# Patient Record
Sex: Female | Born: 1965 | Hispanic: No | Marital: Married | Smoking: Never smoker
Health system: Southern US, Community
[De-identification: ages and names within clinical notes are randomized; demographics above are authoritative.]

## PROBLEM LIST (undated history)

## (undated) HISTORY — PX: GASTRIC BYPASS: SHX52

---

## 2008-11-18 ENCOUNTER — Emergency Department (HOSPITAL_BASED_OUTPATIENT_CLINIC_OR_DEPARTMENT_OTHER): Admission: EM | Admit: 2008-11-18 | Discharge: 2008-11-18 | Payer: Self-pay | Admitting: Emergency Medicine

## 2010-06-30 ENCOUNTER — Emergency Department (HOSPITAL_BASED_OUTPATIENT_CLINIC_OR_DEPARTMENT_OTHER): Admission: EM | Admit: 2010-06-30 | Discharge: 2010-06-30 | Payer: Self-pay | Admitting: Emergency Medicine

## 2010-06-30 ENCOUNTER — Ambulatory Visit: Payer: Self-pay | Admitting: Interventional Radiology

## 2010-12-14 LAB — POCT CARDIAC MARKERS: Myoglobin, poc: 32.4 ng/mL (ref 12–200)

## 2010-12-14 LAB — CBC
HCT: 37.9 % (ref 36.0–46.0)
MCHC: 33.4 g/dL (ref 30.0–36.0)
MCV: 87 fL (ref 78.0–100.0)
RDW: 11.9 % (ref 11.5–15.5)

## 2010-12-14 LAB — COMPREHENSIVE METABOLIC PANEL
BUN: 14 mg/dL (ref 6–23)
Calcium: 9 mg/dL (ref 8.4–10.5)
Creatinine, Ser: 0.6 mg/dL (ref 0.4–1.2)
Glucose, Bld: 83 mg/dL (ref 70–99)
Total Protein: 7.4 g/dL (ref 6.0–8.3)

## 2010-12-14 LAB — DIFFERENTIAL
Basophils Relative: 1 % (ref 0–1)
Lymphs Abs: 2 10*3/uL (ref 0.7–4.0)
Monocytes Relative: 5 % (ref 3–12)
Neutro Abs: 3 10*3/uL (ref 1.7–7.7)
Neutrophils Relative %: 53 % (ref 43–77)

## 2017-12-25 ENCOUNTER — Emergency Department (HOSPITAL_COMMUNITY): Payer: 59

## 2017-12-25 ENCOUNTER — Other Ambulatory Visit: Payer: Self-pay

## 2017-12-25 ENCOUNTER — Encounter (HOSPITAL_COMMUNITY): Payer: Self-pay

## 2017-12-25 ENCOUNTER — Emergency Department (HOSPITAL_COMMUNITY)
Admission: EM | Admit: 2017-12-25 | Discharge: 2017-12-25 | Disposition: A | Discharge status: Home / Self Care | Payer: 59 | Attending: Emergency Medicine | Admitting: Emergency Medicine

## 2017-12-25 DIAGNOSIS — R079 Chest pain, unspecified: Secondary | ICD-10-CM | POA: Diagnosis present

## 2017-12-25 LAB — BASIC METABOLIC PANEL
ANION GAP: 8 (ref 5–15)
BUN: 7 mg/dL (ref 6–20)
CALCIUM: 8.8 mg/dL — AB (ref 8.9–10.3)
CO2: 22 mmol/L (ref 22–32)
Chloride: 107 mmol/L (ref 101–111)
Creatinine, Ser: 0.61 mg/dL (ref 0.44–1.00)
GLUCOSE: 97 mg/dL (ref 65–99)
Potassium: 3.8 mmol/L (ref 3.5–5.1)
Sodium: 137 mmol/L (ref 135–145)

## 2017-12-25 LAB — CBC
HCT: 38.2 % (ref 36.0–46.0)
Hemoglobin: 12.2 g/dL (ref 12.0–15.0)
MCH: 28 pg (ref 26.0–34.0)
MCHC: 31.9 g/dL (ref 30.0–36.0)
MCV: 87.6 fL (ref 78.0–100.0)
PLATELETS: 262 10*3/uL (ref 150–400)
RBC: 4.36 MIL/uL (ref 3.87–5.11)
RDW: 14 % (ref 11.5–15.5)
WBC: 7.2 10*3/uL (ref 4.0–10.5)

## 2017-12-25 LAB — I-STAT TROPONIN, ED: TROPONIN I, POC: 0 ng/mL (ref 0.00–0.08)

## 2017-12-25 NOTE — Discharge Instructions (Addendum)
The testing today, is reassuring.  There is no sign that you have had a heart attack or will have a heart attack.  Your pain may be from nerves or muscle, and associated with stress.  It is okay to take Tylenol as needed for pain.  I suggest that you call your PCP to arrange a follow-up appointment to discuss the episode today, and consider further testing and treatment.  Your doctor may recommend a stress test to further evaluate you for cardiac problems.  Please return here if you have additional concerns and need to be checked again.

## 2017-12-25 NOTE — ED Provider Notes (Addendum)
[facility] EMERGENCY DEPARTMENT Provider Note   CSN: [id number] Arrival date & time: 12/25/17  1215     History   Chief Complaint Chief Complaint  Patient presents with  . Chest Pain    HPI Dana Navarro is a 52 y.o. female.  She presents for evaluation of chest discomfort the patient has had similar episodes of chest pain previously to that which happened earlier today.  Today she was dealing with a stressful situation at work, when she had a tight feeling in her chest.  Later she developed some pain in her left arm.  Both of these persisted until she saw her PCP, who treated her with nitroglycerin and aspirin.  EMS was summoned who transferred her here and treated her with additional nitroglycerin x1.  Her chest pain resolved after the initial nitroglycerin and has not returned.  She has noticed some persistent pain in her left arm, but it is very mild.  She has occasional neck pain, that seems to come and go.  She denies any associated fever, chills, nausea, vomiting, diaphoresis, gait difficulty or dizziness.  There are no other known modifying factors.  HPI  History reviewed. No pertinent past medical history.  There are no active problems to display for this patient.   Past Surgical History:  Procedure Laterality Date  . GASTRIC BYPASS       OB History    Gravida  1   Para      Term      Preterm      AB      Living        SAB      TAB      Ectopic      Multiple      Live Births               Home Medications    Prior to Admission medications   Not on File    Family History No family history on file.  Social History Social History   Tobacco Use  . Smoking status: Never Smoker  . Smokeless tobacco: Never Used  Substance Use Topics  . Alcohol use: Never    Frequency: Never  . Drug use: Never     Allergies   Patient has no known allergies.   Review of Systems Review of Systems  All other systems reviewed and  are negative.    Physical Exam Updated Vital Signs BP 120/84 (BP Location: Right Arm)   Pulse 65   Temp 98.3 F (36.8 C) (Oral)   Resp 16   Ht 5\' 4"  (1.626 m)   Wt 120.2 kg (265 lb)   LMP 11/28/2017 (Approximate) Comment: "end of last month"   SpO2 92%   BMI 45.49 kg/m   Physical Exam  Constitutional: She is oriented to person, place, and time. She appears well-developed. She does not appear ill.  Morbidly obese  HENT:  Head: Normocephalic and atraumatic.  Eyes: Pupils are equal, round, and reactive to light. Conjunctivae and EOM are normal.  Neck: Normal range of motion and phonation normal. Neck supple.  Cardiovascular: Normal rate and regular rhythm.  Pulmonary/Chest: Effort normal and breath sounds normal. She exhibits no tenderness.  Abdominal: Soft. She exhibits no distension. There is no tenderness. There is no guarding.  Musculoskeletal: Normal range of motion. She exhibits no edema, tenderness or deformity.  Neurological: She is alert and oriented to person, place, and time. She exhibits normal muscle tone.  Skin:  Skin is warm and dry.  Psychiatric: She has a normal mood and affect. Her behavior is normal. Judgment and thought content normal.  Nursing note and vitals reviewed.    ED Treatments / Results  Labs (all labs ordered are listed, but only abnormal results are displayed) Labs Reviewed  BASIC METABOLIC PANEL - Abnormal; Notable for the following components:      Result Value   Calcium 8.8 (*)    All other components within normal limits  CBC  I-STAT TROPONIN, ED    EKG None    Date: 12/25/17  Rate: 75  Rhythm: normal sinus rhythm  QRS Axis: normal  PR and QT Intervals: normal  ST/T Wave abnormalities: nonspecific T wave changes  PR and QRS Conduction Disutrbances:none     Radiology Dg Chest 2 View  Result Date: 12/25/2017 CLINICAL DATA:  Left chest pain EXAM: CHEST - 2 VIEW COMPARISON:  06/30/2010 FINDINGS: Lungs are clear.  No pleural  effusion or pneumothorax. The heart is normal in size. Visualized osseous structures are within normal limits. IMPRESSION: Normal chest radiographs. Electronically Signed   By: Charline Bills M.D.   On: 12/25/2017 13:08    Procedures Procedures (including critical care time)  Medications Ordered in ED Medications - No data to display   Initial Impression / Assessment and Plan / ED Course  I have reviewed the triage vital signs and the nursing notes.  Pertinent labs & imaging results that were available during my care of the patient were reviewed by me and considered in my medical decision making (see chart for details).      Patient Vitals for the past 24 hrs:  BP Temp Temp src Pulse Resp SpO2 Height Weight  12/25/17 1632 120/84 - - 65 16 92 % - -  12/25/17 1409 111/63 - - 70 16 99 % - -  12/25/17 1230 - - - - - - 5\' 4"  (1.626 m) 120.2 kg (265 lb)  12/25/17 1228 126/89 98.3 F (36.8 C) Oral 66 18 100 % - -    6:42 PM Reevaluation with update and discussion. After initial assessment and treatment, an updated evaluation reveals no change in clinical status.  Findings discussed with patient and her husband, all questions were answered. Mancel Bale      Final Clinical Impressions(s) / ED Diagnoses   Final diagnoses:  Nonspecific chest pain   Noncardiac chest and arm pain, unlikely to represent an acute coronary syndrome, or significant pulmonary process.  Doubt ACS, PE or pneumonia.  Possible cervical radiculopathy versus muscle pain from stress related event.  No indication for further ED evaluation or hospitalization at this time.   Nursing Notes Reviewed/ Care Coordinated Applicable Imaging Reviewed Interpretation of Laboratory Data incorporated into ED treatment  The patient appears reasonably screened and/or stabilized for discharge and I doubt any other medical condition or other [facility] requiring further screening, evaluation, or treatment in the ED at this time prior  to discharge.  Plan: Home Medications-OTC analgesia as needed; Home Treatments-rest, gradually increase activities.  Consider stress management techniques; return here if the recommended treatment, does not improve the symptoms; Recommended follow up-PCP follow-up for further evaluation and treatment as needed 1-2 weeks.     ED Discharge Orders    None       Mancel Bale, MD 12/25/17 1844    Mancel Bale, MD 01/06/18 [phone]

## 2017-12-25 NOTE — ED Triage Notes (Signed)
Pt. Was at work, developed lt., arm pain that radiated into her lt. Chest area.  Pt. Denies any sob  , n/v, skin was warm and dry.  Pt. Is alert and oriented X4.  Pt. Reports having stress at work.  She drove herself to the MD and they did an ECG and she received 324 mg ASA and 1 nitro.  The nitro relieved the chest pain but pt. Continues to have the lt. Arm pain,.  Pt. Received another dose of Nitro via paramedics her chest pain is resolved and lt. Arm is constant but not as sharp.   ECG  And labs completed in triage .  Pt. Had an episode like this yesterday, pt. Took an Advil and it went away.

## 2018-07-03 ENCOUNTER — Encounter (HOSPITAL_COMMUNITY): Payer: 59

## 2018-07-04 ENCOUNTER — Inpatient Hospital Stay (HOSPITAL_COMMUNITY): Admission: RE | Admit: 2018-07-04 | Payer: 59 | Source: Ambulatory Visit

## 2018-07-08 ENCOUNTER — Other Ambulatory Visit (HOSPITAL_COMMUNITY): Payer: Self-pay | Admitting: Physician Assistant

## 2020-05-30 ENCOUNTER — Encounter (HOSPITAL_BASED_OUTPATIENT_CLINIC_OR_DEPARTMENT_OTHER): Payer: Self-pay | Admitting: *Deleted

## 2020-05-30 ENCOUNTER — Emergency Department (HOSPITAL_BASED_OUTPATIENT_CLINIC_OR_DEPARTMENT_OTHER)
Admission: EM | Admit: 2020-05-30 | Discharge: 2020-05-30 | Disposition: A | Discharge status: Home / Self Care | Payer: 59 | Attending: Emergency Medicine | Admitting: Emergency Medicine

## 2020-05-30 ENCOUNTER — Emergency Department (HOSPITAL_BASED_OUTPATIENT_CLINIC_OR_DEPARTMENT_OTHER): Payer: 59

## 2020-05-30 ENCOUNTER — Other Ambulatory Visit: Payer: Self-pay

## 2020-05-30 DIAGNOSIS — R1012 Left upper quadrant pain: Secondary | ICD-10-CM | POA: Diagnosis not present

## 2020-05-30 DIAGNOSIS — R10819 Abdominal tenderness, unspecified site: Secondary | ICD-10-CM | POA: Insufficient documentation

## 2020-05-30 DIAGNOSIS — Z79899 Other long term (current) drug therapy: Secondary | ICD-10-CM | POA: Insufficient documentation

## 2020-05-30 DIAGNOSIS — R1011 Right upper quadrant pain: Secondary | ICD-10-CM | POA: Diagnosis present

## 2020-05-30 LAB — CBC WITH DIFFERENTIAL/PLATELET
Abs Immature Granulocytes: 0.02 10*3/uL (ref 0.00–0.07)
Basophils Absolute: 0 10*3/uL (ref 0.0–0.1)
Basophils Relative: 0 %
Eosinophils Absolute: 0.2 10*3/uL (ref 0.0–0.5)
Eosinophils Relative: 2 %
HCT: 37.9 % (ref 36.0–46.0)
Hemoglobin: 11.7 g/dL — ABNORMAL LOW (ref 12.0–15.0)
Immature Granulocytes: 0 %
Lymphocytes Relative: 18 %
Lymphs Abs: 1.7 10*3/uL (ref 0.7–4.0)
MCH: 27.1 pg (ref 26.0–34.0)
MCHC: 30.9 g/dL (ref 30.0–36.0)
MCV: 87.7 fL (ref 80.0–100.0)
Monocytes Absolute: 0.5 10*3/uL (ref 0.1–1.0)
Monocytes Relative: 6 %
Neutro Abs: 6.9 10*3/uL (ref 1.7–7.7)
Neutrophils Relative %: 74 %
Platelets: 260 10*3/uL (ref 150–400)
RBC: 4.32 MIL/uL (ref 3.87–5.11)
RDW: 14.4 % (ref 11.5–15.5)
WBC: 9.3 10*3/uL (ref 4.0–10.5)
nRBC: 0 % (ref 0.0–0.2)

## 2020-05-30 LAB — URINALYSIS, ROUTINE W REFLEX MICROSCOPIC
Bilirubin Urine: NEGATIVE
Glucose, UA: NEGATIVE mg/dL
Ketones, ur: NEGATIVE mg/dL
Leukocytes,Ua: NEGATIVE
Nitrite: NEGATIVE
Protein, ur: NEGATIVE mg/dL
Specific Gravity, Urine: <1.005 — ABNORMAL LOW (ref 1.005–1.030)
pH: 5.5 (ref 5.0–8.0)

## 2020-05-30 LAB — URINALYSIS, MICROSCOPIC (REFLEX)

## 2020-05-30 LAB — COMPREHENSIVE METABOLIC PANEL
ALT: 18 U/L (ref 0–44)
AST: 20 U/L (ref 15–41)
Albumin: 3.9 g/dL (ref 3.5–5.0)
Alkaline Phosphatase: 65 U/L (ref 38–126)
Anion gap: 9 (ref 5–15)
BUN: 16 mg/dL (ref 6–20)
CO2: 20 mmol/L — ABNORMAL LOW (ref 22–32)
Calcium: 8.6 mg/dL — ABNORMAL LOW (ref 8.9–10.3)
Chloride: 109 mmol/L (ref 98–111)
Creatinine, Ser: 0.67 mg/dL (ref 0.44–1.00)
GFR calc Af Amer: >60 mL/min (ref >60)
GFR calc non Af Amer: >60 mL/min (ref >60)
Glucose, Bld: 95 mg/dL (ref 70–99)
Potassium: 3.9 mmol/L (ref 3.5–5.1)
Sodium: 138 mmol/L (ref 135–145)
Total Bilirubin: 0.4 mg/dL (ref 0.3–1.2)
Total Protein: 7.2 g/dL (ref 6.5–8.1)

## 2020-05-30 LAB — PREGNANCY, URINE: Preg Test, Ur: NEGATIVE

## 2020-05-30 LAB — LIPASE, BLOOD: Lipase: 29 U/L (ref 11–51)

## 2020-05-30 MED ORDER — OMEPRAZOLE 20 MG PO CPDR: 20.0000 mg | DELAYED_RELEASE_CAPSULE | Freq: Every day | ORAL | 0 refills | Status: DC

## 2020-05-30 MED FILL — OMEPRAZOLE 20 MG CAP: 20 | 21 days supply | Qty: 21 | Fill #0

## 2020-05-30 NOTE — ED Notes (Signed)
ED Provider at bedside. 

## 2020-05-30 NOTE — Discharge Instructions (Addendum)
You have been seen here for abdominal pain.  Lab work and imaging all look reassuring.  I prescribed you an acid pill please take as prescribed.  I also provided you food choices for acid reflux.  You may take over-the-counter pain medications like Tylenol every 6 hours as needed for pain.  Please follow dosing on the back of bottle.  I have given the contact information for [facility] please call them as I feel you need further follow-up with them.  I want to come back to the emergency department if develop severe chest pain, shortness of breath, severe abdominal pain, uncontrolled nausea, vomiting, diarrhea as these symptoms require further evaluation management.

## 2020-05-30 NOTE — ED Provider Notes (Signed)
[place] EMERGENCY DEPARTMENT Provider Note   CSN: [id number] Arrival date & time: 05/30/20  1104     History Chief Complaint  Patient presents with  . Abdominal Pain  . Back Pain    Dana Navarro is a 54 y.o. female.  HPI Patient with significant medical history of cholecystectomy, gastric bypass presents to the emergency department with right upper quadrant pain that has been going for the last year.  She explains over the last 3 months the pain has increasingly gotten worse.  She describes a consistent sharp stabbing pain in her right upper quadrant that feels sometimes in her right upper flank.  She denies any associated nausea, vomiting, diarrhea, fevers, chills, shortness of breath, dysuria. she does note that when she lays down the pain tends to get better but sitting up and moving around generally makes the pain worse.  She denies any association with food, has no history of pancreatitis, kidney stones, or liver abnormalities.  Patient denies headache, fever, chills, shortness of breath, chest pain, nausea, vomiting, diarrhea, pedal edema.    History reviewed. No pertinent past medical history.  There are no problems to display for this patient.   Past Surgical History:  Procedure Laterality Date  . CHOLECYSTECTOMY    . GASTRIC BYPASS    . GASTRIC BYPASS       OB History    Gravida  1   Para      Term      Preterm      AB      Living        SAB      TAB      Ectopic      Multiple      Live Births              History reviewed. No pertinent family history.  Social History   Tobacco Use  . Smoking status: Never Smoker  . Smokeless tobacco: Never Used  Substance Use Topics  . Alcohol use: Never  . Drug use: Never    Home Medications Prior to Admission medications   Medication Sig Start Date End Date Taking? Authorizing Provider  omeprazole (PRILOSEC) 20 MG capsule Take 1 capsule (20 mg total) by mouth daily for 21 days.  05/30/20 06/20/20  Marcello Fennel, PA-C    Allergies    Patient has no known allergies.  Review of Systems   Review of Systems  Constitutional: Negative for chills and fever.  HENT: Negative for congestion, sore throat and tinnitus.   Respiratory: Negative for cough and shortness of breath.   Cardiovascular: Negative for chest pain.  Gastrointestinal: Positive for abdominal pain. Negative for nausea and vomiting.  Genitourinary: Positive for flank pain. Negative for dysuria, enuresis and vaginal bleeding.  Musculoskeletal: Negative for back pain.  Skin: Negative for rash.  Neurological: Negative for dizziness and headaches.  Hematological: Does not bruise/bleed easily.    Physical Exam Updated Vital Signs BP 118/71 (BP Location: Right Arm)   Pulse 86   Temp 98.6 F (37 C) (Oral)   Resp 18   Ht _0  (1.575 m)   Wt 119.3 kg   LMP 05/06/2020   SpO2 100%   Breastfeeding Unknown   BMI 48.10 kg/m   Physical Exam Vitals and nursing note reviewed.  Constitutional:      General: She is not in acute distress.    Appearance: She is not ill-appearing.  HENT:     Head: Normocephalic and  atraumatic.     Nose: No congestion.     Mouth/Throat:     Mouth: Mucous membranes are moist.     Pharynx: Oropharynx is clear.  Eyes:     General: No scleral icterus. Cardiovascular:     Rate and Rhythm: Normal rate and regular rhythm.     Pulses: Normal pulses.     Heart sounds: No murmur heard.  No friction rub. No gallop.   Pulmonary:     Effort: No respiratory distress.     Breath sounds: No wheezing, rhonchi or rales.  Abdominal:     General: There is no distension.     Palpations: Abdomen is soft.     Tenderness: There is abdominal tenderness. There is no right CVA tenderness, left CVA tenderness or guarding.     Comments: Patient's abdomen was visualized, nondistended, normal active bowel sounds, dull to percussion, slight tenderness to palpation noted in the epigastric  region as well as the right upper quadrant.  No rebound tenderness, negative peritoneal sign.  Musculoskeletal:        General: No swelling.     Right lower leg: No edema.     Left lower leg: No edema.  Skin:    General: Skin is warm and dry.     Capillary Refill: Capillary refill takes less than 2 seconds.     Findings: No rash.  Neurological:     General: No focal deficit present.     Mental Status: She is alert.  Psychiatric:        Mood and Affect: Mood normal.     ED Results / Procedures / Treatments   Labs (all labs ordered are listed, but only abnormal results are displayed) Labs Reviewed  CBC WITH DIFFERENTIAL/PLATELET - Abnormal; Notable for the following components:      Result Value   Hemoglobin 11.7 (*)    All other components within normal limits  COMPREHENSIVE METABOLIC PANEL - Abnormal; Notable for the following components:   CO2 20 (*)    Calcium 8.6 (*)    All other components within normal limits  URINALYSIS, ROUTINE W REFLEX MICROSCOPIC - Abnormal; Notable for the following components:   Specific Gravity, Urine <1.005 (*)    Hgb urine dipstick TRACE (*)    All other components within normal limits  URINALYSIS, MICROSCOPIC (REFLEX) - Abnormal; Notable for the following components:   Bacteria, UA RARE (*)    All other components within normal limits  LIPASE, BLOOD  PREGNANCY, URINE    EKG None  Radiology US Abdomen Limited  Result Date: 05/30/2020 CLINICAL DATA:  Right upper quadrant pain. EXAM: ULTRASOUND ABDOMEN LIMITED RIGHT UPPER QUADRANT COMPARISON:  None. FINDINGS: Gallbladder: Status post cholecystectomy. Common bile duct: Diameter: 0.4 cm. Liver: Single calcification measuring 1.1 cm which may be due to old granulomatous disease. No worrisome liver lesion. Echogenicity is increased. Portal vein is patent on color Doppler imaging with normal direction of blood flow towards the liver. Other: None. IMPRESSION: No acute abnormality. Fatty  infiltration of the liver. Status post cholecystectomy. Electronically Signed   By: Inge Rise M.D.   On: 05/30/2020 13:59   DG Chest Port 1 View  Result Date: 05/30/2020 CLINICAL DATA:  Right upper abdominal pain. EXAM: PORTABLE CHEST 1 VIEW COMPARISON:  12/25/2017 FINDINGS: Cardiomediastinal silhouette is accentuated by low lung volumes. Both lungs are clear. Low lung volumes. No pleural effusion. No pneumothorax. The visualized skeletal structures are unremarkable. Left upper quadrant clips. IMPRESSION: No acute cardiopulmonary  disease. Electronically Signed   By: Margaretha Sheffield MD   On: 05/30/2020 14:52    Procedures Procedures (including critical care time)  Medications Ordered in ED Medications - No data to display  ED Course  I have reviewed the triage vital signs and the nursing notes.  Pertinent labs & imaging results that were available during my care of the patient were reviewed by me and considered in my medical decision making (see chart for details).    MDM Rules/Calculators/A&P                          I have personally reviewed all imaging, labs and have interpreted them.  Patient presents with right upper quadrant pain that is been gone for the last year.  She was alert and oriented did not appear to be in acute distress, vital signs reassuring.  On exam patient lung sounds were clear bilaterally, she had notable epigastric pain as well as right quadrant pain, no signs of acute abdomen noted during exam.  No pedal edema noted.  Will order screening labs and chest x-ray and limited ultrasound for further evaluation.  CBC does not show leukocytosis but does show normocytic anemia which appears to be at baseline for patient.  CMP does not show leukocytosis, or elevated liver enzymes or AKI.  UA does not show nitrates or leukocytes, lipase was 29. Limited ultrasound showed fatty infiltrates without any acute abnormalities.  Chest x-ray does not show any acute  abnormalities.  I have low suspicion patient suffering from an intra-abdominal abnormality requiring surgical intervention as there was no acute abdomen noted on exam, lipase was 26, ultrasound not show any acute abnormalities, UA negative for leukocytes or nitrites making UTI or pyelonephritis unlikely. patient tolerating p.o. without difficulty.  Low suspicion for systemic infection as patient was nontoxic-appearing, vital signs reassuring, no leukocytosis seen on CBC, no abnormalities noted on chest x-ray.   Patient appears resting comfortably in bed showing no acute signs distress.  Vital signs have remained stable does not meet criteria be met the hospital.  Differential diagnosis includes acid reflux, IBS, irritation from fibroids will start patient on an acid pill and refer to GI for further follow-up.  Patient was discussed with attending who agrees assessment plan.  Patient was given at home care as well strict return precautions.  Patient verbalized that she understood and agreed to plan.  Final Clinical Impression(s) / ED Diagnoses Final diagnoses:  Left upper quadrant abdominal pain    Rx / DC Orders ED Discharge Orders         Ordered    omeprazole (PRILOSEC) 20 MG capsule  Daily        05/30/20 1425           Marcello Fennel, [place] 05/30/20 1500    Lucrezia Starch, MD 06/02/20 [phone]

## 2020-05-30 NOTE — ED Notes (Signed)
CXR done, at  Bedside

## 2020-05-30 NOTE — ED Notes (Signed)
Patient transported to Ultrasound 

## 2020-05-30 NOTE — ED Triage Notes (Signed)
Intermittent RUQ pain radiating to her right mid back for more than a month

## 2021-10-30 ENCOUNTER — Emergency Department (HOSPITAL_BASED_OUTPATIENT_CLINIC_OR_DEPARTMENT_OTHER): Payer: BC Managed Care – PPO

## 2021-10-30 ENCOUNTER — Other Ambulatory Visit: Payer: Self-pay

## 2021-10-30 ENCOUNTER — Encounter (HOSPITAL_BASED_OUTPATIENT_CLINIC_OR_DEPARTMENT_OTHER): Payer: Self-pay

## 2021-10-30 ENCOUNTER — Emergency Department (HOSPITAL_BASED_OUTPATIENT_CLINIC_OR_DEPARTMENT_OTHER)
Admission: EM | Admit: 2021-10-30 | Discharge: 2021-10-30 | Disposition: A | Discharge status: Home / Self Care | Payer: BC Managed Care – PPO | Attending: Emergency Medicine | Admitting: Emergency Medicine

## 2021-10-30 DIAGNOSIS — M549 Dorsalgia, unspecified: Secondary | ICD-10-CM | POA: Insufficient documentation

## 2021-10-30 DIAGNOSIS — F4321 Adjustment disorder with depressed mood: Secondary | ICD-10-CM

## 2021-10-30 DIAGNOSIS — R079 Chest pain, unspecified: Secondary | ICD-10-CM

## 2021-10-30 DIAGNOSIS — R071 Chest pain on breathing: Secondary | ICD-10-CM | POA: Diagnosis not present

## 2021-10-30 LAB — TROPONIN I (HIGH SENSITIVITY)
Troponin I (High Sensitivity): <2 ng/L (ref <18)
Troponin I (High Sensitivity): <2 ng/L (ref <18)

## 2021-10-30 LAB — COMPREHENSIVE METABOLIC PANEL
ALT: 27 U/L (ref 0–44)
AST: 25 U/L (ref 15–41)
Albumin: 3.6 g/dL (ref 3.5–5.0)
Alkaline Phosphatase: 80 U/L (ref 38–126)
Anion gap: 9 (ref 5–15)
BUN: 11 mg/dL (ref 6–20)
CO2: 24 mmol/L (ref 22–32)
Calcium: 8.7 mg/dL — ABNORMAL LOW (ref 8.9–10.3)
Chloride: 104 mmol/L (ref 98–111)
Creatinine, Ser: 0.58 mg/dL (ref 0.44–1.00)
GFR, Estimated: >60 mL/min (ref >60)
Glucose, Bld: 110 mg/dL — ABNORMAL HIGH (ref 70–99)
Potassium: 3.6 mmol/L (ref 3.5–5.1)
Sodium: 137 mmol/L (ref 135–145)
Total Bilirubin: 0.6 mg/dL (ref 0.3–1.2)
Total Protein: 6.9 g/dL (ref 6.5–8.1)

## 2021-10-30 LAB — CBC WITH DIFFERENTIAL/PLATELET
Abs Immature Granulocytes: 0.01 10*3/uL (ref 0.00–0.07)
Basophils Absolute: 0 10*3/uL (ref 0.0–0.1)
Basophils Relative: 1 %
Eosinophils Absolute: 0.2 10*3/uL (ref 0.0–0.5)
Eosinophils Relative: 5 %
HCT: 39.8 % (ref 36.0–46.0)
Hemoglobin: 12.9 g/dL (ref 12.0–15.0)
Immature Granulocytes: 0 %
Lymphocytes Relative: 34 %
Lymphs Abs: 1.6 10*3/uL (ref 0.7–4.0)
MCH: 28.3 pg (ref 26.0–34.0)
MCHC: 32.4 g/dL (ref 30.0–36.0)
MCV: 87.3 fL (ref 80.0–100.0)
Monocytes Absolute: 0.3 10*3/uL (ref 0.1–1.0)
Monocytes Relative: 6 %
Neutro Abs: 2.5 10*3/uL (ref 1.7–7.7)
Neutrophils Relative %: 54 %
Platelets: 235 10*3/uL (ref 150–400)
RBC: 4.56 MIL/uL (ref 3.87–5.11)
RDW: 14.4 % (ref 11.5–15.5)
WBC: 4.6 10*3/uL (ref 4.0–10.5)
nRBC: 0 % (ref 0.0–0.2)

## 2021-10-30 LAB — D-DIMER, QUANTITATIVE: D-Dimer, Quant: <0.27 ug/mL-FEU (ref 0.00–0.50)

## 2021-10-30 NOTE — ED Notes (Addendum)
Pain started in back at 1030 this am and it hurt to take a deep breath , and then her chest got tight  has no heart hx

## 2021-10-30 NOTE — ED Notes (Signed)
ED Provider at bedside. 

## 2021-10-30 NOTE — ED Notes (Addendum)
Presents with sharp pain at mid upper back area, states pain began just prior to arrival to the ED. States feels some shortness of breath, facial grimacing intermittently noted. Informs RN that pain radiates to left breast

## 2021-10-30 NOTE — ED Provider Notes (Signed)
[facility] EMERGENCY DEPARTMENT Provider Note   CSN: [id number] Arrival date & time: 10/30/21  1043     History  Chief Complaint  Patient presents with   Chest Pain    Dana Navarro is a 56 y.o. female with no past medical history presenting today with a complaint of some left-sided upper back pain as well as pleuritic chest pain.  Patient states that she was at work when all of a sudden she had a sharp pain in her back and then when she was taking deep breaths she felt this pain in the middle of her chest.  Denies any history of DVT, PE, recent travel, recent surgery, estrogen use or smoking.    Home Medications Prior to Admission medications   Medication Sig Start Date End Date Taking? Authorizing Provider  omeprazole (PRILOSEC) 20 MG capsule Take 1 capsule (20 mg total) by mouth daily for 21 days. 05/30/20 06/20/20  Carroll Sage, PA-C      Allergies    Patient has no known allergies.    Review of Systems   Review of Systems  Cardiovascular:  Positive for chest pain.  See HPI Physical Exam Updated Vital Signs BP (!) 145/93 (BP Location: Left Arm)    Pulse (!) 110    Temp 98.4 F (36.9 C) (Oral)    Resp 20    Ht 5\' 2"  (1.575 m)    Wt 109.8 kg    LMP 10/09/2021    SpO2 100%    BMI 44.26 kg/m  Physical Exam Vitals and nursing note reviewed.  Constitutional:      General: She is not in acute distress.    Appearance: Normal appearance. She is not ill-appearing.  HENT:     Head: Normocephalic and atraumatic.  Eyes:     General: No scleral icterus.    Conjunctiva/sclera: Conjunctivae normal.  Cardiovascular:     Rate and Rhythm: Normal rate and regular rhythm.  Pulmonary:     Effort: Pulmonary effort is normal. No respiratory distress.     Breath sounds: Normal breath sounds.  Chest:     Chest wall: Tenderness (lower third of sternum) present.  Abdominal:     Tenderness: There is no abdominal tenderness.  Musculoskeletal:     Comments: No tenderness in  the left shoulder or scapula.  Full range of motion.  Skin:    Findings: No rash.  Neurological:     Mental Status: She is alert.  Psychiatric:        Mood and Affect: Mood normal.    ED Results / Procedures / Treatments   Labs (all labs ordered are listed, but only abnormal results are displayed) Labs Reviewed  COMPREHENSIVE METABOLIC PANEL - Abnormal; Notable for the following components:      Result Value   Glucose, Bld 110 (*)    Calcium 8.7 (*)    All other components within normal limits  CBC WITH DIFFERENTIAL/PLATELET  D-DIMER, QUANTITATIVE  TROPONIN I (HIGH SENSITIVITY)  TROPONIN I (HIGH SENSITIVITY)    EKG None  Radiology No results found.  Procedures Procedures  {Patient in normal sinus rhythm at a normal rate)  Medications Ordered in ED Medications - No data to display  ED Course/ Medical Decision Making/ A&P                           Medical Decision Making Amount and/or Complexity of Data Reviewed Labs: ordered. Radiology: ordered.  Patient presents to the ED for concern of chest pain The emergent differential diagnosis of chest pain includes: Acute coronary syndrome, pericarditis, aortic dissection, pulmonary embolism, tension pneumothorax, and esophageal rupture.   Co morbidities that complicate the patient evaluation       Grief from family loss   Lab Tests:  I Ordered, and personally interpreted labs.  The pertinent results include: Negative troponin x2, negative D-dimer.   Imaging Studies ordered:  I ordered imaging studies including chest x-ray.  I independently visualized and interpreted this and agree with the read of no signs of cardiopulmonary abnormalities.   Cardiac Monitoring:  The patient was maintained on a cardiac monitor.  I personally viewed and interpreted the cardiac monitored which showed an underlying rhythm of: Normal sinus rhythm with a normal rate   Medicines ordered and prescription drug management: Patient  reports she does not like taking medications.  We discussed that ibuprofen and Tylenol are appropriate over-the-counter medications.   Test Considered:  CT angiogram.  Decided not to, patient low likelihood Wells and negative D-dimer.  Problem List / ED Course:  Nonspecific chest pain, grief  Reevaluation:  After the interventions noted above, I reevaluated the patient and found that they have :improved due to a decrease in anxiety.   Social Determinants of Health:  Loss of her husband and no familial assistance in the [place].  Likely moving back to [place].  Dispostion:  After consideration of the diagnostic results and the patients response to treatment, I feel that the patent would benefit from follow-up with her primary care provider and grief services.  She is agreeable to this plan.  She will take ibuprofen and Tylenol for continued pain and return with any worsening symptoms.  Final Clinical Impression(s) / ED Diagnoses Final diagnoses:  Nonspecific chest pain    Rx / DC Orders Results and diagnoses were explained to the patient. Return precautions discussed in full. Patient had no additional questions and expressed complete understanding.   This chart was dictated using voice recognition software.  Despite best efforts to proofread,  errors can occur which can change the documentation meaning.    Darliss Ridgel 10/30/21 1554    Gareth Morgan, MD 11/02/21 2202

## 2021-10-30 NOTE — Discharge Instructions (Addendum)
Schedule follow-up appointment with your primary care provider if you need to reevaluate your symptoms. I think it is a good idea to follow-up with your primary care provider about grief counseling as well. As we discussed, ibuprofen is a good treatment of your pain.  It was a pleasure to meet you today and I hope that you feel better.

## 2021-10-30 NOTE — ED Triage Notes (Signed)
Pt arrives to ED with reports of sudden pain to left back with some CP starting today while getting ready for work. Pt states pain is worse in the back then the chest. Pain is worse with movement.

## 2022-02-11 IMAGING — DX DG CHEST 1V PORT
1 series · 1 of 1 positions shown · non-contrast
Comparison: 12/25/2017

CLINICAL DATA: Right upper abdominal pain.

EXAM:
PORTABLE CHEST 1 VIEW

[chest ap]
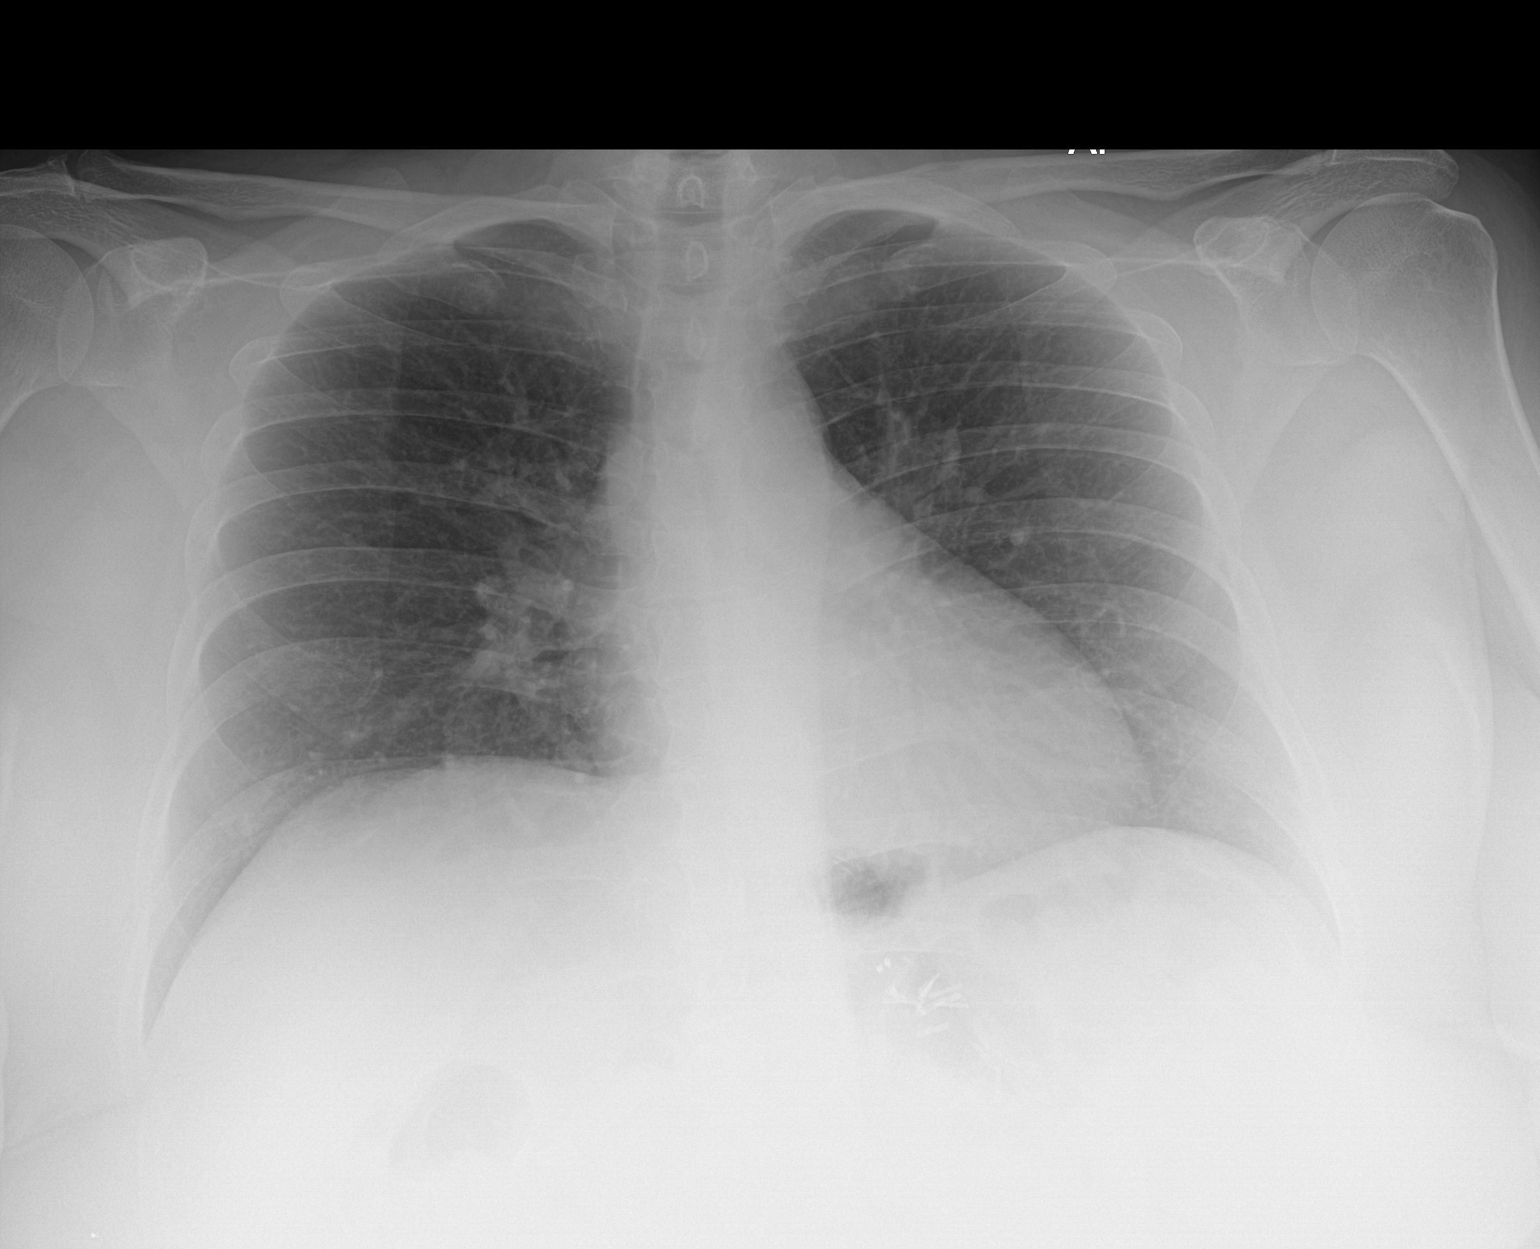

[1 of 1 positions shown; findings below may reference images not displayed]

FINDINGS: Cardiomediastinal silhouette is accentuated by low lung volumes.
Both lungs are clear. Low lung volumes. No pleural effusion. No
pneumothorax. The visualized skeletal structures are unremarkable.
Left upper quadrant clips.
IMPRESSION: No acute cardiopulmonary disease.

## 2023-04-10 ENCOUNTER — Emergency Department (HOSPITAL_BASED_OUTPATIENT_CLINIC_OR_DEPARTMENT_OTHER): Payer: BC Managed Care – PPO

## 2023-04-10 ENCOUNTER — Emergency Department (HOSPITAL_BASED_OUTPATIENT_CLINIC_OR_DEPARTMENT_OTHER)
Admission: EM | Admit: 2023-04-10 | Discharge: 2023-04-10 | Disposition: A | Discharge status: Home / Self Care | Payer: BC Managed Care – PPO | Attending: Emergency Medicine | Admitting: Emergency Medicine

## 2023-04-10 ENCOUNTER — Encounter (HOSPITAL_BASED_OUTPATIENT_CLINIC_OR_DEPARTMENT_OTHER): Payer: Self-pay

## 2023-04-10 DIAGNOSIS — R791 Abnormal coagulation profile: Secondary | ICD-10-CM | POA: Insufficient documentation

## 2023-04-10 DIAGNOSIS — R519 Headache, unspecified: Secondary | ICD-10-CM | POA: Insufficient documentation

## 2023-04-10 DIAGNOSIS — R002 Palpitations: Secondary | ICD-10-CM | POA: Insufficient documentation

## 2023-04-10 DIAGNOSIS — R110 Nausea: Secondary | ICD-10-CM | POA: Diagnosis not present

## 2023-04-10 DIAGNOSIS — R42 Dizziness and giddiness: Secondary | ICD-10-CM | POA: Diagnosis present

## 2023-04-10 LAB — COMPREHENSIVE METABOLIC PANEL
ALT: 41 U/L (ref 0–44)
AST: 32 U/L (ref 15–41)
Albumin: 4 g/dL (ref 3.5–5.0)
Alkaline Phosphatase: 92 U/L (ref 38–126)
Anion gap: 9 (ref 5–15)
BUN: 16 mg/dL (ref 6–20)
CO2: 22 mmol/L (ref 22–32)
Calcium: 8.6 mg/dL — ABNORMAL LOW (ref 8.9–10.3)
Chloride: 104 mmol/L (ref 98–111)
Creatinine, Ser: 0.57 mg/dL (ref 0.44–1.00)
GFR, Estimated: >60 mL/min (ref >60)
Glucose, Bld: 98 mg/dL (ref 70–99)
Potassium: 3.7 mmol/L (ref 3.5–5.1)
Sodium: 135 mmol/L (ref 135–145)
Total Bilirubin: 0.4 mg/dL (ref 0.3–1.2)
Total Protein: 7.4 g/dL (ref 6.5–8.1)

## 2023-04-10 LAB — PROTIME-INR
INR: 0.9 (ref 0.8–1.2)
Prothrombin Time: 12.4 seconds (ref 11.4–15.2)

## 2023-04-10 LAB — CBC
HCT: 40.5 % (ref 36.0–46.0)
Hemoglobin: 13.7 g/dL (ref 12.0–15.0)
MCH: 30 pg (ref 26.0–34.0)
MCHC: 33.8 g/dL (ref 30.0–36.0)
MCV: 88.8 fL (ref 80.0–100.0)
Platelets: 274 10*3/uL (ref 150–400)
RBC: 4.56 MIL/uL (ref 3.87–5.11)
RDW: 13.3 % (ref 11.5–15.5)
WBC: 8.1 10*3/uL (ref 4.0–10.5)
nRBC: 0 % (ref 0.0–0.2)

## 2023-04-10 LAB — DIFFERENTIAL
Abs Immature Granulocytes: 0.03 10*3/uL (ref 0.00–0.07)
Basophils Absolute: 0 10*3/uL (ref 0.0–0.1)
Basophils Relative: 0 %
Eosinophils Absolute: 0.3 10*3/uL (ref 0.0–0.5)
Eosinophils Relative: 3 %
Immature Granulocytes: 0 %
Lymphocytes Relative: 24 %
Lymphs Abs: 1.9 10*3/uL (ref 0.7–4.0)
Monocytes Absolute: 0.5 10*3/uL (ref 0.1–1.0)
Monocytes Relative: 6 %
Neutro Abs: 5.4 10*3/uL (ref 1.7–7.7)
Neutrophils Relative %: 67 %

## 2023-04-10 LAB — ETHANOL: Alcohol, Ethyl (B): <10 mg/dL (ref <10)

## 2023-04-10 LAB — CBG MONITORING, ED: Glucose-Capillary: 98 mg/dL (ref 70–99)

## 2023-04-10 LAB — APTT: aPTT: 26 seconds (ref 24–36)

## 2023-04-10 LAB — TROPONIN I (HIGH SENSITIVITY): Troponin I (High Sensitivity): <2 ng/L (ref <18)

## 2023-04-10 MED ORDER — LORAZEPAM 2 MG/ML IJ SOLN: 1.0000 mg | Freq: Once | INTRAMUSCULAR | Status: DC | PRN

## 2023-04-10 MED ORDER — MECLIZINE HCL 12.5 MG PO TABS: 12.5000 mg | ORAL_TABLET | Freq: Three times a day (TID) | ORAL | 3 refills | Status: DC | PRN

## 2023-04-10 NOTE — ED Notes (Signed)
Dr. Silverio Lay notified of patient

## 2023-04-10 NOTE — ED Provider Notes (Signed)
[place] [facility] Provider Note   CSN: [id number] Arrival date & time: 04/10/23  1602     History Chief Complaint  Patient presents with   Dizziness     Dana Navarro is a 57 y.o. female presenting for dizziness.  Dizziness started yesterday while she was in the shower.  This morning, she continued to feel dizzy and also experienced palpitations, nausea, and discomfort on the left side of her face at 1:00 PM.   She denies feeling dizzy when moving her head but finds that dizziness worsens when she closes her eyes. She denies having headaches or chest pain but reports numbness and a tingling sensation in her right hand.  There is no noticeable drooping of the face, her speech is clear, and there is no weakness on one side of her body. She denies experiencing ringing in the ears or hearing loss.   Patient's recorded medical, surgical, social, medication list and allergies were reviewed in the Snapshot window as part of the initial history.   Review of Systems   Review of Systems  Neurological:  Positive for dizziness.    Physical Exam Updated Vital Signs BP 109/66   Pulse 81   Temp 98.9 F (37.2 C) (Oral)   Resp 19   Ht 5\' 2"  (1.575 m)   Wt 107.5 kg   LMP 04/03/2023   SpO2 98%   BMI 43.35 kg/m  Physical Exam General: Pleasant, well-appearing middle-age. Not in acute distress.   CV: RRR. No murmurs, rubs, or gallops. No LE edema Pulmonary: Lungs CTAB. Normal effort. No wheezing or rales. Abdominal: Soft, nontender, nondistended. Normal bowel sounds. Extremities: Palpable pulses. Normal ROM. Skin: Warm and dry. No obvious rash or lesions. Neuro: A&Ox3. Moves all extremities. Normal sensation. No focal deficit. Psych: Normal mood and affect   ED Course/ Medical Decision Making/ A&P    Procedures Procedures   Medications Ordered in ED Medications  LORazepam (ATIVAN) injection 1 mg (has no administration in time range)     Medical Decision Making:    Dana Navarro is a 57 y.o. female who presented to the ED today with dizziness detailed above.     Patient placed on continuous vitals and telemetry monitoring while in ED which was reviewed periodically.   Complete initial physical exam performed, notably the patient  was stable. She has normal blood pressure, tachycardic to 105.  No focal deficits on physical exam.   Reviewed and confirmed nursing documentation for past medical history, family history, social history.    Initial Assessment:   With the patient's presentation of dizziness, potential differential diagnosis included stroke versus BPPV versus Mnire's.  She will need MRI of the brain without contrast and MRA of the neck to rule out an acute stroke.  This is most consistent with an acute complicated illness  Initial Plan:  MRI of the brain without contrast. MR angio neck without contrast Screening labs including CBC and Metabolic panel to evaluate for infectious or metabolic etiology of disease.  Urinalysis with reflex culture ordered to evaluate for UTI or relevant urologic/nephrologic pathology.  CXR to evaluate for structural/infectious intrathoracic pathology.  EKG to evaluate for cardiac pathology. Objective evaluation as below reviewed with plan for close reassessment  Initial Study Results:   Laboratory  All laboratory results reviewed without evidence of clinically relevant pathology.    EKG EKG was reviewed independently. Rate, rhythm, axis, intervals all examined and without medically relevant abnormality. ST segments without concerns for  elevations.    Radiology  All images reviewed independently. Agree with radiology report at this time.   MR BRAIN WO CONTRAST  Result Date: 04/10/2023 CLINICAL DATA:  Provided history: Neuro deficit, acute, stroke suspected. Additional history provided: Dizziness. Palpitations. Nausea. Left-sided facial discomfort. EXAM: MRI HEAD WITHOUT  CONTRAST MRA HEAD WITHOUT CONTRAST MRA NECK WITHOUT CONTRAST TECHNIQUE: Multiplanar, multi-echo pulse sequences of the brain and surrounding structures were acquired without intravenous contrast. Angiographic images of the Circle of Willis were acquired using MRA technique without intravenous contrast. Angiographic images of the neck were acquired using MRA technique without intravenous contrast. Carotid stenosis measurements (when applicable) are obtained utilizing NASCET criteria, using the distal internal carotid diameter as the denominator. COMPARISON:  None. FINDINGS: MRI HEAD FINDINGS Brain: Cerebral volume is normal. There are a few small nonspecific foci of T2 FLAIR hyperintense signal abnormality within the bilateral cerebral white matter. Partially empty sella turcica. No cortical encephalomalacia is identified. There is no acute infarct. No evidence of an intracranial mass. No chronic intracranial blood products. No extra-axial fluid collection. No midline shift. Vascular: Maintained flow voids within the proximal large arterial vessels. Dominant right vertebral artery. Skull and upper cervical spine: No focal suspicious marrow lesion. Sinuses/Orbits: No mass or acute finding within the imaged orbits. No significant paranasal sinus disease. MRA HEAD FINDINGS Anterior circulation: The intracranial internal carotid arteries are patent. The M1 middle cerebral arteries are patent. No M2 proximal branch occlusion or high-grade proximal stenosis. The anterior cerebral arteries are patent. No intracranial aneurysm is identified. Posterior circulation: The intracranial vertebral arteries are patent. Dominant right vertebral artery. The basilar artery is patent. The posterior cerebral arteries are patent. Posterior communicating arteries are diminutive or absent, bilaterally. Anatomic variants: As described. MRA NECK FINDINGS Aortic arch: Standard aortic branching. Right carotid system: CCA and ICA patent within  the neck without stenosis. Left carotid system: CCA and ICA patent within the neck without stenosis. Vertebral arteries: The vertebral arteries are patent within the neck. The right vertebral artery is dominant. Motion artifact limits evaluation of the left vertebral artery proximal V1 segment. Within this limitation, there is no appreciable cervical vertebral artery stenosis. IMPRESSION: MRI brain: 1. No evidence of an acute infarct. 2. There are a few small nonspecific T2 FLAIR hyperintense chronic insults within the cerebral white matter. 3. Partially empty sella turcica. This finding can reflect incidental anatomic variation, or alternatively, it can be associated with idiopathic intracranial hypertension (pseudotumor cerebri). 4. Otherwise unremarkable non-contrast MRI appearance of the brain. MRA head: No intracranial large vessel occlusion or proximal high-grade arterial stenosis. MRA neck: 1. The common carotid and internal carotid arteries are patent within the neck without stenosis. 2. The vertebral arteries are patent within the neck. Motion artifact limits evaluation of the left vertebral artery proximal V1 segment. Within this limitation, there is no appreciable cervical vertebral artery stenosis. Electronically Signed   By: Jackey Loge D.O.   On: 04/10/2023 19:04   MR ANGIO HEAD WO CONTRAST  Result Date: 04/10/2023 CLINICAL DATA:  Provided history: Neuro deficit, acute, stroke suspected. Additional history provided: Dizziness. Palpitations. Nausea. Left-sided facial discomfort. EXAM: MRI HEAD WITHOUT CONTRAST MRA HEAD WITHOUT CONTRAST MRA NECK WITHOUT CONTRAST TECHNIQUE: Multiplanar, multi-echo pulse sequences of the brain and surrounding structures were acquired without intravenous contrast. Angiographic images of the Circle of Willis were acquired using MRA technique without intravenous contrast. Angiographic images of the neck were acquired using MRA technique without intravenous contrast.  Carotid stenosis measurements (when applicable) are  obtained utilizing NASCET criteria, using the distal internal carotid diameter as the denominator. COMPARISON:  None. FINDINGS: MRI HEAD FINDINGS Brain: Cerebral volume is normal. There are a few small nonspecific foci of T2 FLAIR hyperintense signal abnormality within the bilateral cerebral white matter. Partially empty sella turcica. No cortical encephalomalacia is identified. There is no acute infarct. No evidence of an intracranial mass. No chronic intracranial blood products. No extra-axial fluid collection. No midline shift. Vascular: Maintained flow voids within the proximal large arterial vessels. Dominant right vertebral artery. Skull and upper cervical spine: No focal suspicious marrow lesion. Sinuses/Orbits: No mass or acute finding within the imaged orbits. No significant paranasal sinus disease. MRA HEAD FINDINGS Anterior circulation: The intracranial internal carotid arteries are patent. The M1 middle cerebral arteries are patent. No M2 proximal branch occlusion or high-grade proximal stenosis. The anterior cerebral arteries are patent. No intracranial aneurysm is identified. Posterior circulation: The intracranial vertebral arteries are patent. Dominant right vertebral artery. The basilar artery is patent. The posterior cerebral arteries are patent. Posterior communicating arteries are diminutive or absent, bilaterally. Anatomic variants: As described. MRA NECK FINDINGS Aortic arch: Standard aortic branching. Right carotid system: CCA and ICA patent within the neck without stenosis. Left carotid system: CCA and ICA patent within the neck without stenosis. Vertebral arteries: The vertebral arteries are patent within the neck. The right vertebral artery is dominant. Motion artifact limits evaluation of the left vertebral artery proximal V1 segment. Within this limitation, there is no appreciable cervical vertebral artery stenosis. IMPRESSION: MRI  brain: 1. No evidence of an acute infarct. 2. There are a few small nonspecific T2 FLAIR hyperintense chronic insults within the cerebral white matter. 3. Partially empty sella turcica. This finding can reflect incidental anatomic variation, or alternatively, it can be associated with idiopathic intracranial hypertension (pseudotumor cerebri). 4. Otherwise unremarkable non-contrast MRI appearance of the brain. MRA head: No intracranial large vessel occlusion or proximal high-grade arterial stenosis. MRA neck: 1. The common carotid and internal carotid arteries are patent within the neck without stenosis. 2. The vertebral arteries are patent within the neck. Motion artifact limits evaluation of the left vertebral artery proximal V1 segment. Within this limitation, there is no appreciable cervical vertebral artery stenosis. Electronically Signed   By: Jackey Loge D.O.   On: 04/10/2023 19:04   MR ANGIO NECK WO CONTRAST  Result Date: 04/10/2023 CLINICAL DATA:  Provided history: Neuro deficit, acute, stroke suspected. Additional history provided: Dizziness. Palpitations. Nausea. Left-sided facial discomfort. EXAM: MRI HEAD WITHOUT CONTRAST MRA HEAD WITHOUT CONTRAST MRA NECK WITHOUT CONTRAST TECHNIQUE: Multiplanar, multi-echo pulse sequences of the brain and surrounding structures were acquired without intravenous contrast. Angiographic images of the Circle of Willis were acquired using MRA technique without intravenous contrast. Angiographic images of the neck were acquired using MRA technique without intravenous contrast. Carotid stenosis measurements (when applicable) are obtained utilizing NASCET criteria, using the distal internal carotid diameter as the denominator. COMPARISON:  None. FINDINGS: MRI HEAD FINDINGS Brain: Cerebral volume is normal. There are a few small nonspecific foci of T2 FLAIR hyperintense signal abnormality within the bilateral cerebral white matter. Partially empty sella turcica. No  cortical encephalomalacia is identified. There is no acute infarct. No evidence of an intracranial mass. No chronic intracranial blood products. No extra-axial fluid collection. No midline shift. Vascular: Maintained flow voids within the proximal large arterial vessels. Dominant right vertebral artery. Skull and upper cervical spine: No focal suspicious marrow lesion. Sinuses/Orbits: No mass or acute finding within the imaged orbits.  No significant paranasal sinus disease. MRA HEAD FINDINGS Anterior circulation: The intracranial internal carotid arteries are patent. The M1 middle cerebral arteries are patent. No M2 proximal branch occlusion or high-grade proximal stenosis. The anterior cerebral arteries are patent. No intracranial aneurysm is identified. Posterior circulation: The intracranial vertebral arteries are patent. Dominant right vertebral artery. The basilar artery is patent. The posterior cerebral arteries are patent. Posterior communicating arteries are diminutive or absent, bilaterally. Anatomic variants: As described. MRA NECK FINDINGS Aortic arch: Standard aortic branching. Right carotid system: CCA and ICA patent within the neck without stenosis. Left carotid system: CCA and ICA patent within the neck without stenosis. Vertebral arteries: The vertebral arteries are patent within the neck. The right vertebral artery is dominant. Motion artifact limits evaluation of the left vertebral artery proximal V1 segment. Within this limitation, there is no appreciable cervical vertebral artery stenosis. IMPRESSION: MRI brain: 1. No evidence of an acute infarct. 2. There are a few small nonspecific T2 FLAIR hyperintense chronic insults within the cerebral white matter. 3. Partially empty sella turcica. This finding can reflect incidental anatomic variation, or alternatively, it can be associated with idiopathic intracranial hypertension (pseudotumor cerebri). 4. Otherwise unremarkable non-contrast MRI  appearance of the brain. MRA head: No intracranial large vessel occlusion or proximal high-grade arterial stenosis. MRA neck: 1. The common carotid and internal carotid arteries are patent within the neck without stenosis. 2. The vertebral arteries are patent within the neck. Motion artifact limits evaluation of the left vertebral artery proximal V1 segment. Within this limitation, there is no appreciable cervical vertebral artery stenosis. Electronically Signed   By: Jackey Loge D.O.   On: 04/10/2023 19:04      Reassessment and Plan:   CBC and CMP normal.  Troponin negative. EKG negative for ischemic changes or ST elevations. MRI of brain w/o contrast showed no evidence of acute infarct. MR angio neck w/o contrast was also normal.  I suspect patient's dizziness is due to vertigo.  Clinical Impression:  1. Dizziness      Discharge   Final Clinical Impression(s) / ED Diagnoses Final diagnoses:  Dizziness    Rx / DC Orders ED Discharge Orders          Ordered    meclizine (ANTIVERT) 12.5 MG tablet  3 times daily PRN        04/10/23 1927                Laretta Bolster, MD 04/16/23 1944    Charlynne Pander, MD 04/16/23 2033

## 2023-04-10 NOTE — ED Triage Notes (Addendum)
States was in the shower yesterday morning and became dizzy. Started having palpitations, nausea, and left sided facial discomfort at 1300.  No facial droop noted. Speech clear, no unilateral weakness.

## 2023-04-10 NOTE — ED Notes (Signed)
Today is pts bday states she bent to pick up something in shower and felr dizzy and today  she had palitations

## 2023-07-14 IMAGING — DX DG CHEST 1V PORT
1 series · 1 of 1 positions shown · non-contrast
Comparison: 05/30/2020

CLINICAL DATA: Chest pain, back pain

EXAM:
PORTABLE CHEST 1 VIEW

[chest ap]
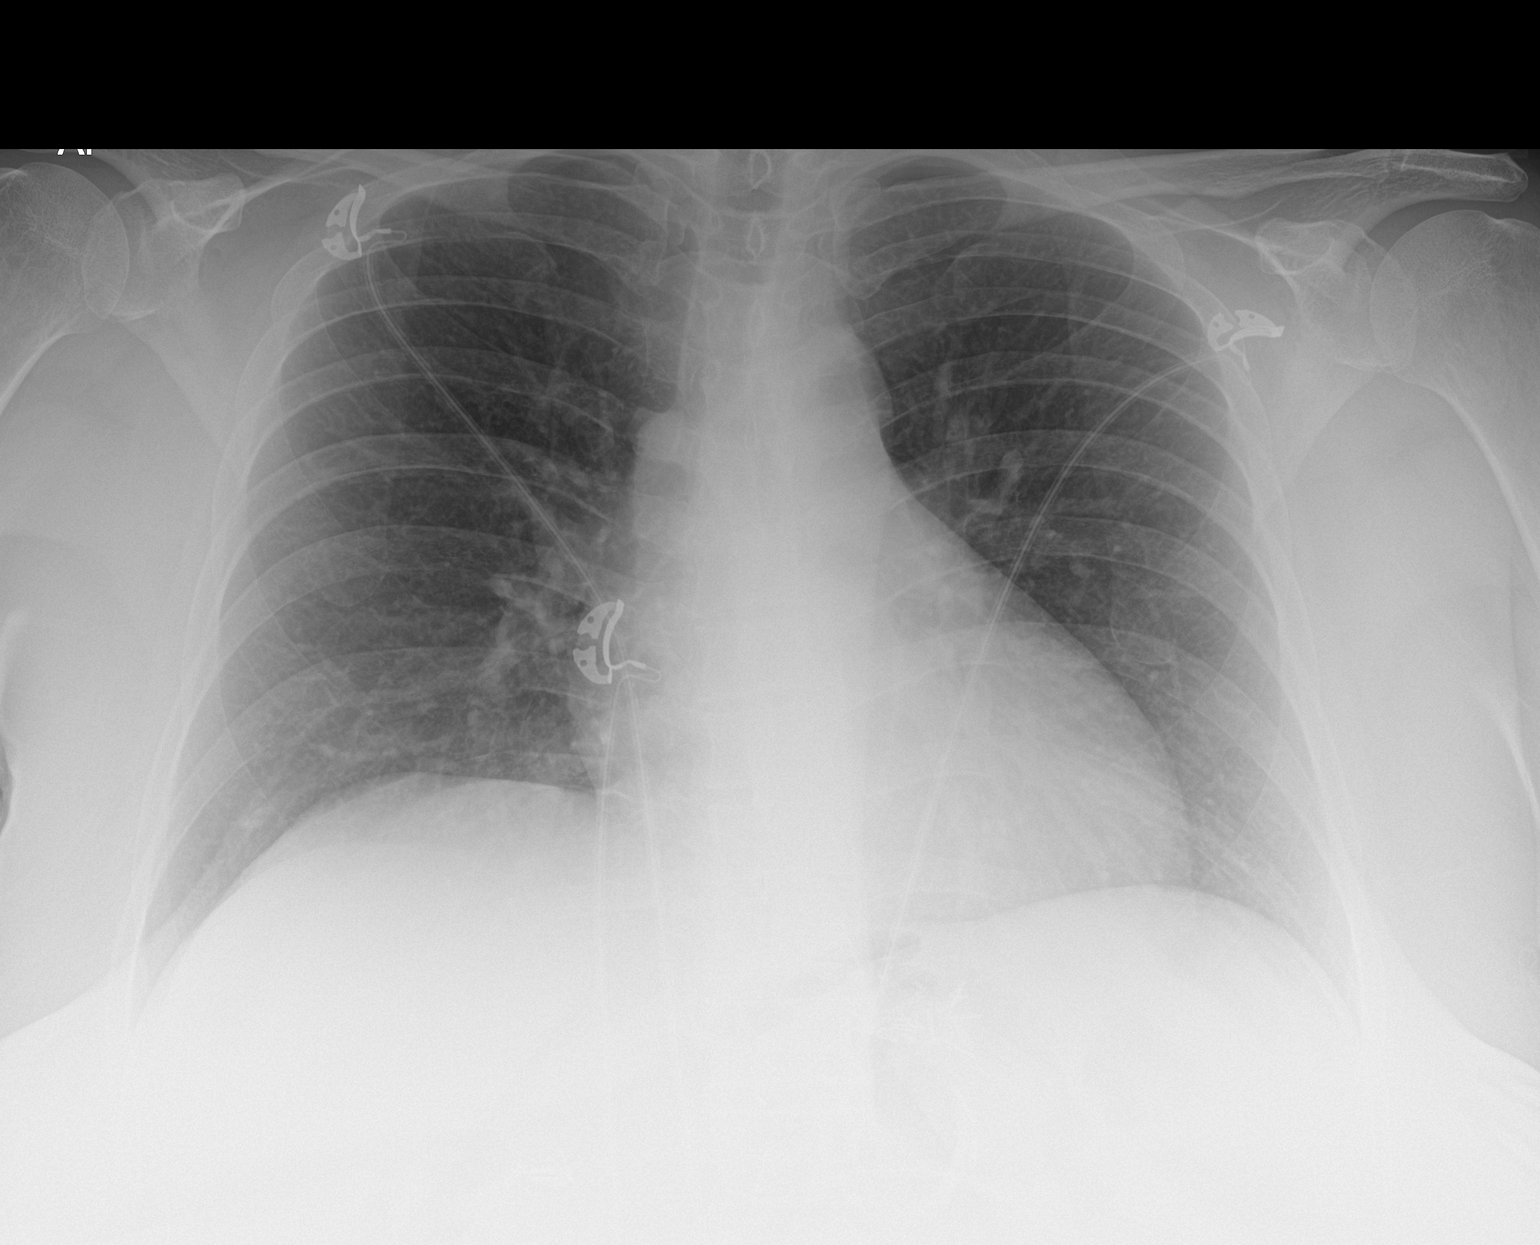

[1 of 1 positions shown; findings below may reference images not displayed]

FINDINGS: Transverse diameter of heart is increased. There are no signs of
pulmonary edema or focal pulmonary consolidation. There is no
pleural effusion or pneumothorax.
IMPRESSION: Cardiomegaly. There are no signs of pulmonary edema or focal
pulmonary consolidation.

## 2023-12-18 ENCOUNTER — Emergency Department (HOSPITAL_BASED_OUTPATIENT_CLINIC_OR_DEPARTMENT_OTHER)
Admission: EM | Admit: 2023-12-18 | Discharge: 2023-12-18 | Disposition: A | Discharge status: Home / Self Care | Attending: Emergency Medicine | Admitting: Emergency Medicine

## 2023-12-18 ENCOUNTER — Other Ambulatory Visit: Payer: Self-pay

## 2023-12-18 ENCOUNTER — Emergency Department (HOSPITAL_BASED_OUTPATIENT_CLINIC_OR_DEPARTMENT_OTHER)

## 2023-12-18 ENCOUNTER — Encounter (HOSPITAL_BASED_OUTPATIENT_CLINIC_OR_DEPARTMENT_OTHER): Payer: Self-pay | Admitting: Emergency Medicine

## 2023-12-18 DIAGNOSIS — I493 Ventricular premature depolarization: Secondary | ICD-10-CM | POA: Diagnosis not present

## 2023-12-18 DIAGNOSIS — R002 Palpitations: Secondary | ICD-10-CM

## 2023-12-18 DIAGNOSIS — R42 Dizziness and giddiness: Secondary | ICD-10-CM | POA: Diagnosis present

## 2023-12-18 LAB — PREGNANCY, URINE: Preg Test, Ur: NEGATIVE

## 2023-12-18 LAB — CBC
HCT: 39 % (ref 36.0–46.0)
Hemoglobin: 12.8 g/dL (ref 12.0–15.0)
MCH: 29.1 pg (ref 26.0–34.0)
MCHC: 32.8 g/dL (ref 30.0–36.0)
MCV: 88.6 fL (ref 80.0–100.0)
Platelets: 250 10*3/uL (ref 150–400)
RBC: 4.4 MIL/uL (ref 3.87–5.11)
RDW: 13.4 % (ref 11.5–15.5)
WBC: 7.1 10*3/uL (ref 4.0–10.5)
nRBC: 0 % (ref 0.0–0.2)

## 2023-12-18 LAB — BASIC METABOLIC PANEL
Anion gap: 7 (ref 5–15)
BUN: 16 mg/dL (ref 6–20)
CO2: 23 mmol/L (ref 22–32)
Calcium: 8.5 mg/dL — ABNORMAL LOW (ref 8.9–10.3)
Chloride: 106 mmol/L (ref 98–111)
Creatinine, Ser: 0.52 mg/dL (ref 0.44–1.00)
GFR, Estimated: >60 mL/min (ref >60)
Glucose, Bld: 126 mg/dL — ABNORMAL HIGH (ref 70–99)
Potassium: 4.4 mmol/L (ref 3.5–5.1)
Sodium: 136 mmol/L (ref 135–145)

## 2023-12-18 LAB — URINALYSIS, ROUTINE W REFLEX MICROSCOPIC
Bilirubin Urine: NEGATIVE
Glucose, UA: NEGATIVE mg/dL
Ketones, ur: 15 mg/dL — AB
Leukocytes,Ua: NEGATIVE
Nitrite: NEGATIVE
Protein, ur: NEGATIVE mg/dL
Specific Gravity, Urine: >=1.03 (ref 1.005–1.030)
pH: 5.5 (ref 5.0–8.0)

## 2023-12-18 LAB — URINALYSIS, MICROSCOPIC (REFLEX)

## 2023-12-18 LAB — CBG MONITORING, ED: Glucose-Capillary: 115 mg/dL — ABNORMAL HIGH (ref 70–99)

## 2023-12-18 MED ORDER — MECLIZINE HCL 25 MG PO TABS: 25.0000 mg | ORAL_TABLET | Freq: Three times a day (TID) | ORAL | 0 refills | Status: AC | PRN

## 2023-12-18 MED ORDER — MECLIZINE HCL 25 MG PO TABS
25.0000 mg | ORAL_TABLET | Freq: Once | ORAL | Status: AC
  Administered 2023-12-18: 25 mg via ORAL
  Filled 2023-12-18: qty 1

## 2023-12-18 NOTE — ED Notes (Signed)
 Checked CBG 115, RN informed

## 2023-12-18 NOTE — ED Provider Notes (Signed)
 [place] [facility] Provider Note   CSN: [id number] Arrival date & time: 12/18/23  0054     History  Chief Complaint  Patient presents with   Dizziness    Dana Navarro is a 58 y.o. female.  The history is provided by the patient.  Dizziness Dana Navarro is a 58 y.o. female who presents to the Emergency Department complaining of dizziness and palpitations.  Symptoms started a few days ago and are waxing and waning.  She did have increased exercise starting about 2 weeks ago.  She also complains of a mild headache in her left temple that comes and goes when she has her dizziness.  No associated chest pain or shortness of breath.  She also reports intermittent tingling to the left face, left arm and left leg.  This is described as a pins-and-needles sensation that rapidly spreads from place to place.  She has no known medical problems and takes no routine medications.  She does have a history of vertigo in the past, this feels somewhat different.      Home Medications Prior to Admission medications   Medication Sig Start Date End Date Taking? Authorizing Provider  meclizine (ANTIVERT) 25 MG tablet Take 1 tablet (25 mg total) by mouth 3 (three) times daily as needed for dizziness. 12/18/23  Yes Tilden Fossa, MD  omeprazole (PRILOSEC) 20 MG capsule Take 1 capsule (20 mg total) by mouth daily for 21 days. 05/30/20 06/20/20  Carroll Sage, PA-C      Allergies    Patient has no known allergies.    Review of Systems   Review of Systems  Neurological:  Positive for dizziness.  All other systems reviewed and are negative.   Physical Exam Updated Vital Signs BP 126/68   Pulse 81   Temp 98.2 F (36.8 C)   Resp 16   Ht 5\' 2"  (1.575 m)   Wt 108.9 kg   SpO2 98%   BMI 43.90 kg/m  Physical Exam Vitals and nursing note reviewed.  Constitutional:      Appearance: She is well-developed.  HENT:     Head: Normocephalic and atraumatic.   Cardiovascular:     Rate and Rhythm: Normal rate and regular rhythm.     Heart sounds: No murmur heard. Pulmonary:     Effort: Pulmonary effort is normal. No respiratory distress.     Breath sounds: Normal breath sounds.  Abdominal:     Palpations: Abdomen is soft.     Tenderness: There is no abdominal tenderness. There is no guarding or rebound.  Musculoskeletal:        General: No tenderness.  Skin:    General: Skin is warm and dry.  Neurological:     Mental Status: She is alert and oriented to person, place, and time.     Comments: 5/5 strength in all four extremities with sensation to light touch intact in all four extremities.  Visual fields grossly intact.  There is nystagmus on gaze to the right.   Psychiatric:        Behavior: Behavior normal.     ED Results / Procedures / Treatments   Labs (all labs ordered are listed, but only abnormal results are displayed) Labs Reviewed  URINALYSIS, ROUTINE W REFLEX MICROSCOPIC - Abnormal; Notable for the following components:      Result Value   Hgb urine dipstick TRACE (*)    Ketones, ur 15 (*)    All other components within  normal limits  URINALYSIS, MICROSCOPIC (REFLEX) - Abnormal; Notable for the following components:   Bacteria, UA FEW (*)    All other components within normal limits  BASIC METABOLIC PANEL - Abnormal; Notable for the following components:   Glucose, Bld 126 (*)    Calcium 8.5 (*)    All other components within normal limits  CBG MONITORING, ED - Abnormal; Notable for the following components:   Glucose-Capillary 115 (*)    All other components within normal limits  CBC  PREGNANCY, URINE    EKG None  Radiology DG Chest Port 1 View Result Date: 12/18/2023 CLINICAL DATA:  Shortness of breath, dizziness, lightheadedness EXAM: PORTABLE CHEST 1 VIEW COMPARISON:  10/30/2021 FINDINGS: Stable cardiomediastinal silhouette. Aortic atherosclerotic calcification. Low lung volumes accentuate pulmonary  vascularity. No focal consolidation, pleural effusion, or pneumothorax. No displaced rib fractures. Surgical clips about the left upper quadrant. IMPRESSION: No active disease. Electronically Signed   By: Minerva Fester M.D.   On: 12/18/2023 02:28   CT HEAD WO CONTRAST Result Date: 12/18/2023 CLINICAL DATA:  Headache with dizziness and left-sided numbness. EXAM: CT HEAD WITHOUT CONTRAST TECHNIQUE: Contiguous axial images were obtained from the base of the skull through the vertex without intravenous contrast. RADIATION DOSE REDUCTION: This exam was performed according to the departmental dose-optimization program which includes automated exposure control, adjustment of the mA and/or kV according to patient size and/or use of iterative reconstruction technique. COMPARISON:  None Available. FINDINGS: Brain: No evidence of acute infarction, hemorrhage, hydrocephalus, extra-axial collection or mass lesion/mass effect. Vascular: No hyperdense vessel or unexpected calcification. Skull: Normal. Negative for fracture or focal lesion. Sinuses/Orbits: No acute finding. Other: None. IMPRESSION: No acute intracranial pathology. Electronically Signed   By: Aram Candela M.D.   On: 12/18/2023 01:53    Procedures Procedures    Medications Ordered in ED Medications  meclizine (ANTIVERT) tablet 25 mg (25 mg Oral Given 12/18/23 0156)    ED Course/ Medical Decision Making/ A&P                                 Medical Decision Making Amount and/or Complexity of Data Reviewed Labs: ordered. Radiology: ordered.   Patient here for evaluation of dizziness, palpitations.  She is somewhat anxious on examination.  She has a symmetric exam aside from mild nystagmus on gaze to the right.  She was treated meclizine with improvement in her symptoms.  She is able to ambulate with a steady gait.  Symptoms have been present for several days.  Low index of suspicion for CVA.  In terms of her palpitations-she did have PVC  on her initial EKG.  No arrhythmia present.  Presentation is not consistent with PE.  Discussed with patient home care for vertigo.  Discussed outpatient follow-up as well as return precautions.        Final Clinical Impression(s) / ED Diagnoses Final diagnoses:  Vertigo  PVC (premature ventricular contraction)  Palpitations    Rx / DC Orders ED Discharge Orders          Ordered    meclizine (ANTIVERT) 25 MG tablet  3 times daily PRN        12/18/23 0512              Tilden Fossa, MD 12/18/23 [phone]

## 2023-12-18 NOTE — ED Notes (Signed)
Pt ambulatory w/ steady gait.

## 2023-12-18 NOTE — ED Triage Notes (Signed)
 Pt reports feeling dizzy and lightheaded. Pt reports waking up at 11:30pm and having a headache, dizziness, and left sided numbness. Also reports palpitations. LKW 8:30pm.

## 2024-05-25 ENCOUNTER — Encounter (HOSPITAL_BASED_OUTPATIENT_CLINIC_OR_DEPARTMENT_OTHER): Payer: Self-pay | Admitting: Emergency Medicine

## 2024-05-25 ENCOUNTER — Emergency Department (HOSPITAL_BASED_OUTPATIENT_CLINIC_OR_DEPARTMENT_OTHER)

## 2024-05-25 ENCOUNTER — Emergency Department (HOSPITAL_BASED_OUTPATIENT_CLINIC_OR_DEPARTMENT_OTHER): Admission: EM | Admit: 2024-05-25 | Discharge: 2024-05-25 | Disposition: A | Discharge status: Home / Self Care

## 2024-05-25 ENCOUNTER — Other Ambulatory Visit: Payer: Self-pay

## 2024-05-25 DIAGNOSIS — R110 Nausea: Secondary | ICD-10-CM | POA: Diagnosis not present

## 2024-05-25 DIAGNOSIS — R002 Palpitations: Secondary | ICD-10-CM | POA: Insufficient documentation

## 2024-05-25 DIAGNOSIS — R6881 Early satiety: Secondary | ICD-10-CM | POA: Diagnosis not present

## 2024-05-25 LAB — CBC
HCT: 40.1 % (ref 36.0–46.0)
Hemoglobin: 13 g/dL (ref 12.0–15.0)
MCH: 28.6 pg (ref 26.0–34.0)
MCHC: 32.4 g/dL (ref 30.0–36.0)
MCV: 88.3 fL (ref 80.0–100.0)
Platelets: 226 K/uL (ref 150–400)
RBC: 4.54 MIL/uL (ref 3.87–5.11)
RDW: 13.3 % (ref 11.5–15.5)
WBC: 7.8 K/uL (ref 4.0–10.5)
nRBC: 0 % (ref 0.0–0.2)

## 2024-05-25 LAB — COMPREHENSIVE METABOLIC PANEL WITH GFR
ALT: 25 U/L (ref 0–44)
AST: 25 U/L (ref 15–41)
Albumin: 4.2 g/dL (ref 3.5–5.0)
Alkaline Phosphatase: 95 U/L (ref 38–126)
Anion gap: 11 (ref 5–15)
BUN: 13 mg/dL (ref 6–20)
CO2: 22 mmol/L (ref 22–32)
Calcium: 9 mg/dL (ref 8.9–10.3)
Chloride: 101 mmol/L (ref 98–111)
Creatinine, Ser: 0.57 mg/dL (ref 0.44–1.00)
GFR, Estimated: >60 mL/min (ref >60)
Glucose, Bld: 119 mg/dL — ABNORMAL HIGH (ref 70–99)
Potassium: 3.8 mmol/L (ref 3.5–5.1)
Sodium: 134 mmol/L — ABNORMAL LOW (ref 135–145)
Total Bilirubin: 0.4 mg/dL (ref 0.0–1.2)
Total Protein: 7.2 g/dL (ref 6.5–8.1)

## 2024-05-25 LAB — URINALYSIS, MICROSCOPIC (REFLEX)

## 2024-05-25 LAB — CBG MONITORING, ED: Glucose-Capillary: 138 mg/dL — ABNORMAL HIGH (ref 70–99)

## 2024-05-25 LAB — URINALYSIS, ROUTINE W REFLEX MICROSCOPIC
Bilirubin Urine: NEGATIVE
Glucose, UA: NEGATIVE mg/dL
Ketones, ur: 15 mg/dL — AB
Leukocytes,Ua: NEGATIVE
Nitrite: NEGATIVE
Protein, ur: NEGATIVE mg/dL
Specific Gravity, Urine: <=1.005 (ref 1.005–1.030)
pH: 5 (ref 5.0–8.0)

## 2024-05-25 LAB — LIPASE, BLOOD: Lipase: 27 U/L (ref 11–51)

## 2024-05-25 LAB — TROPONIN T, HIGH SENSITIVITY: Troponin T High Sensitivity: <15 ng/L (ref 0–19)

## 2024-05-25 MED ORDER — SODIUM CHLORIDE 0.9 % IV BOLUS
1000.0000 mL | Freq: Once | INTRAVENOUS | Status: AC
  Administered 2024-05-25: 1000 mL via INTRAVENOUS

## 2024-05-25 MED ORDER — ONDANSETRON 4 MG PO TBDP: 4.0000 mg | ORAL_TABLET | Freq: Three times a day (TID) | ORAL | 0 refills | Status: AC | PRN

## 2024-05-25 MED ORDER — FAMOTIDINE 20 MG PO TABS: 20.0000 mg | ORAL_TABLET | Freq: Two times a day (BID) | ORAL | 0 refills | Status: AC

## 2024-05-25 MED ORDER — SUCRALFATE 1 G PO TABS: 1.0000 g | ORAL_TABLET | Freq: Three times a day (TID) | ORAL | 1 refills | Status: AC

## 2024-05-25 MED ORDER — PANTOPRAZOLE SODIUM 20 MG PO TBEC: 20.0000 mg | DELAYED_RELEASE_TABLET | Freq: Every day | ORAL | 0 refills | Status: AC

## 2024-05-25 NOTE — Discharge Instructions (Addendum)
 Please read and follow all provided instructions.  Your diagnoses today include:  1. Nausea   2. Early satiety   3. Palpitations     Tests performed today include: Complete blood cell count: No problems, normal hemoglobin Complete metabolic panel: Sodium was slightly low, glucose was slightly high, otherwise no concerning findings Lipase (pancreas function test): Was normal, normal pancreas Troponin: Cardiac enzyme was normal Urinalysis (urine test): No sign of urine infection Vital signs. See below for your results today.   Medications prescribed:  Pantoprazole  - stomach acid reducer (Take this medication everyday)  Pepcid  (famotidine ) -stomach acid reducer (Take this medication twice a day for one week)  Carafate  - for stomach upset and to protect your stomach (Take with meals and at bedtime)  Zofran  (ondansetron ) - for nausea and vomiting  Take any prescribed medications only as directed.  Home care instructions:  Follow any educational materials contained in this packet.  Follow-up instructions: Please follow-up with your primary care provider in the next 7 days for further evaluation of your symptoms.    Return instructions:  SEEK IMMEDIATE MEDICAL ATTENTION IF: The pain does not go away or becomes severe  A temperature above 101F develops  Repeated vomiting occurs (multiple episodes)  The pain becomes localized to portions of the abdomen. The right side could possibly be appendicitis. In an adult, the left lower portion of the abdomen could be colitis or diverticulitis.  Blood is being passed in stools or vomit (bright red or black tarry stools)  You develop chest pain, difficulty breathing, dizziness or fainting, or become confused, poorly responsive, or inconsolable (young children) If you have any other emergent concerns regarding your health  Additional Information: Abdominal (belly) pain can be caused by many things. Your caregiver performed an examination and  possibly ordered blood/urine tests and imaging (CT scan, x-rays, ultrasound). Many cases can be observed and treated at home after initial evaluation in the emergency department. Even though you are being discharged home, abdominal pain can be unpredictable. Therefore, you need a repeated exam if your pain does not resolve, returns, or worsens. Most patients with abdominal pain don't have to be admitted to the hospital or have surgery, but serious problems like appendicitis and gallbladder attacks can start out as nonspecific pain. Many abdominal conditions cannot be diagnosed in one visit, so follow-up evaluations are very important.  Your vital signs today were: BP 128/69   Pulse 80   Temp 98.7 F (37.1 C) (Oral)   Resp 16   Ht 5' 2 (1.575 m)   Wt 103.4 kg   LMP 04/27/2024 (Approximate)   SpO2 100%   BMI 41.70 kg/m  If your blood pressure (bp) was elevated above 135/85 this visit, please have this repeated by your doctor within one month. --------------

## 2024-05-25 NOTE — ED Provider Notes (Signed)
 [place] [facility] AT [facility] Provider Note   CSN: [id number] Arrival date & time: 05/25/24  1146     Patient presents with: Weakness   Dana Navarro is a 58 y.o. female.    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  Patient reports shaking after eating and drinking, early satiety, associated nausea without vomiting, and palpitations.  She denies overt chest pain.  She reports decreased oral intake since symptoms began, but has been drinking water.  Patient also reports very poor sleep, however this has been going on for about 3 years.  She reports recent anniversary of passing of her husband, 3 years ago.  She does report increased stressors, but does not feel particularly anxious.  States that she called her PCP today, was referred to the emergency department. Patient denies signs of stroke including: facial droop, slurred speech, aphasia, weakness/numbness in extremities, imbalance/trouble walking.  Patient does have left-sided lower extremity and facial pain at times.  She has been evaluated for this in the past with MRI/MRA July 2024.  This was negative.  She states that the symptoms come and go.       Prior to Admission medications   Medication Sig Start Date End Date Taking? Authorizing Provider  meclizine  (ANTIVERT ) 25 MG tablet Take 1 tablet (25 mg total) by mouth 3 (three) times daily as needed for dizziness. 12/18/23   Griselda Norris, MD  omeprazole  (PRILOSEC) 20 MG capsule Take 1 capsule (20 mg total) by mouth daily for 21 days. 05/30/20 06/20/20  Waylan Elsie PARAS, PA-C    Allergies: Patient has no known allergies.    Review of Systems  Updated Vital Signs BP 122/79 (BP Location:  Right Arm)   Pulse 94   Temp 98.4 F (36.9 C) (Oral)   Resp 18   Ht 5' 2 (1.575 m)   Wt 103.4 kg   LMP 04/27/2024 (Approximate)   SpO2 99%   BMI 41.70 kg/m   Physical Exam Vitals and nursing note reviewed.  Constitutional:      General: She is not in acute distress.    Appearance: She is well-developed.  HENT:     Head: Normocephalic and atraumatic.     Right Ear: External ear normal.     Left Ear: External ear normal.     Nose: Nose normal.     Mouth/Throat:     Mouth: Mucous membranes are moist.     Pharynx: Uvula midline.  Eyes:     General: Lids are normal.     Extraocular Movements:     Right eye: No nystagmus.     Left eye: No nystagmus.     Conjunctiva/sclera: Conjunctivae normal.     Pupils: Pupils are equal, round, and reactive to light.  Neck:     Vascular: No carotid bruit.     Comments: No carotid bruit Cardiovascular:     Rate and Rhythm: Normal rate and regular rhythm.     Heart sounds: No murmur heard. Pulmonary:     Effort: Pulmonary effort is normal. No respiratory distress.     Breath sounds: Normal breath sounds. No wheezing, rhonchi or rales.  Abdominal:     Palpations: Abdomen is soft.     Tenderness: There is no  abdominal tenderness. There is no guarding or rebound.  Musculoskeletal:     Cervical back: Normal range of motion and neck supple. No tenderness or bony tenderness.     Right lower leg: No edema.     Left lower leg: No edema.  Skin:    General: Skin is warm and dry.     Findings: No rash.  Neurological:     General: No focal deficit present.     Mental Status: She is alert and oriented to person, place, and time. Mental status is at baseline.     GCS: GCS eye subscore is 4. GCS verbal subscore is 5. GCS motor subscore is 6.     Cranial Nerves: No cranial nerve deficit.     Sensory: No sensory deficit.     Motor: No weakness.     Coordination: Coordination normal.     Gait: Gait normal.     Comments: Upper extremity myotomes  tested bilaterally:  C5 Shoulder abduction 5/5 C6 Elbow flexion/wrist extension 5/5 C7 Elbow extension 5/5 C8 Finger flexion 5/5 T1 Finger abduction 5/5  Lower extremity myotomes tested bilaterally: L2 Hip flexion 5/5 L3 Knee extension 5/5 L4 Ankle dorsiflexion 5/5 S1 Ankle plantar flexion 5/5      (all labs ordered are listed, but only abnormal results are displayed) Labs Reviewed  COMPREHENSIVE METABOLIC PANEL WITH GFR - Abnormal; Notable for the following components:      Result Value   Sodium 134 (*)    Glucose, Bld 119 (*)    All other components within normal limits  URINALYSIS, ROUTINE W REFLEX MICROSCOPIC - Abnormal; Notable for the following components:   Hgb urine dipstick TRACE (*)    Ketones, ur 15 (*)    All other components within normal limits  URINALYSIS, MICROSCOPIC (REFLEX) - Abnormal; Notable for the following components:   Bacteria, UA RARE (*)    All other components within normal limits  CBG MONITORING, ED - Abnormal; Notable for the following components:   Glucose-Capillary 138 (*)    All other components within normal limits  CBC  LIPASE, BLOOD  TROPONIN T, HIGH SENSITIVITY    EKG: EKG Interpretation Date/Time:  Monday May 25 2024 12:07:49 EDT Ventricular Rate:  97 PR Interval:  147 QRS Duration:  87 QT Interval:  342 QTC Calculation: 435 R Axis:   34  Text Interpretation: Sinus rhythm Probable anterolateral infarct, old Confirmed by Neysa Clap [phone]) on 05/25/2024 1:59:12 PM  Radiology: No results found.   Procedures   Medications Ordered in the ED  sodium chloride  0.9 % bolus 1,000 mL (has no administration in time range)   ED Course  Patient seen and examined. History obtained directly from patient. Work-up including labs, imaging, EKG ordered in triage, if performed, were reviewed.    Labs/EKG: Independently reviewed and interpreted.  This included: CBC unremarkable; CMP sodium 134, glucose 119 otherwise unremarkable.  I  have added lipase, troponin.  Personally reviewed and interpreted EKG as above, shows some nonspecific changes.  Imaging: Ordered chest x-ray  Medications/Fluids: IV fluid bolus due to recent poor oral intake.  Most recent vital signs reviewed and are as follows: BP 122/79 (BP Location: Right Arm)   Pulse 94   Temp 98.4 F (36.9 C) (Oral)   Resp 18   Ht 5' 2 (1.575 m)   Wt 103.4 kg   LMP 04/27/2024 (Approximate)   SpO2 99%   BMI 41.70 kg/m   Initial impression: Patient with constellation of symptoms,  seems to be worse postprandially.  She does report associated palpitations.  She has had some intermittent left-sided pain symptoms that have been chronic in nature.  No true numbness or strokelike symptoms.  Low concern for an acute stroke today.  Patient does have upcoming follow-up with surgeon in regards to her gastric bypass.  GI workup likely reasonable.  If symptoms and workup reassuring here, would likely trial PPI/H2 inhibitor.  4:23 PM Reassessment performed. Patient appears stable, comfortable.  Labs personally reviewed and interpreted including: Lipase was normal, troponin was normal; UA without signs of infection.  Imaging personally visualized and interpreted including: Chest x-ray, agree negative.  Reviewed pertinent lab work and imaging with patient at bedside. Questions answered.   Most current vital signs reviewed and are as follows: BP 128/69   Pulse 80   Temp 98.7 F (37.1 C) (Oral)   Resp 16   Ht 5' 2 (1.575 m)   Wt 103.4 kg   LMP 04/27/2024 (Approximate)   SpO2 100%   BMI 41.70 kg/m   Plan: Discharge to home.  Patient states that she has evaluation with her surgeon, in [place] , in 9 days.  She shows me an order on her phone for lab testing that they want done.  Discussed that we have performed CBC, BMP, and liver function testing today.  Prescriptions written for: Protonix , Pepcid , Carafate  and Zofran .  Patient appreciates the prescriptions, will  discuss with her doctor prior to starting these.  Other home care instructions discussed: Benjamine diet, maintain good hydration, consider protein shakes if she feels like she is not eating and drinking enough.  ED return instructions discussed: The patient was urged to return to the Emergency Department immediately with worsening of current symptoms, worsening abdominal pain, persistent vomiting, blood noted in stools, fever, or any other concerns. The patient verbalized understanding.   Follow-up instructions discussed: Patient encouraged to follow-up with their PCP in 7 days.                                   Medical Decision Making Amount and/or Complexity of Data Reviewed Labs: ordered. Radiology: ordered.   For this patient's complaint of abdominal pain, nausea, early satiety, the following conditions were considered on the differential diagnosis: gastritis/PUD, enteritis/duodenitis, appendicitis, cholelithiasis/cholecystitis, cholangitis, pancreatitis, ruptured viscus, colitis, diverticulitis, small/large bowel obstruction, cystitis, pyelonephritis, ureteral colic. In women, pelvic inflammatory disease, ovarian cysts, and tubo-ovarian abscess were also considered. Atypical chest etiologies were also considered including ACS, PE, and pneumonia.  Workup including abdominal and cardiac labs were reassuring.  Patient looks very well with normal vital signs.  She has appropriate follow-up.  I recommend that she trial medications for gastritis/duodenitis, adhere to a bland diet, maintain good hydration.  The patient's vital signs, pertinent lab work and imaging were reviewed and interpreted as discussed in the ED course. Hospitalization was considered for further testing, treatments, or serial exams/observation. However as patient is well-appearing, has a stable exam, and reassuring studies today, I do not feel that they warrant admission at this time. This plan was discussed with the patient who  verbalizes agreement and comfort with this plan and seems reliable and able to return to the Emergency Department with worsening or changing symptoms.         Final diagnoses:  Nausea  Early satiety  Palpitations    ED Discharge Orders          Ordered  pantoprazole  (PROTONIX ) 20 MG tablet  Daily        05/25/24 1620    famotidine  (PEPCID ) 20 MG tablet  2 times daily        05/25/24 1620    sucralfate  (CARAFATE ) 1 g tablet  3 times daily with meals & bedtime        05/25/24 1620    ondansetron  (ZOFRAN -ODT) 4 MG disintegrating tablet  Every 8 hours PRN        05/25/24 1620               Desiderio Chew, PA-C 05/25/24 1626    Neysa Caron PARAS, DO 05/26/24 1540

## 2024-05-25 NOTE — Telephone Encounter (Signed)
 States has had dizziness x 3 days. States cannot eat as when she does starts to get the chills and gets nausea. Sometimes the left side of face, leg and arm will get numb. States right now numbness is only on the left side of her face. C/O irregular HR, CP, H/A that comes and goes and rates 3-4/10.   Denies vomiting, fever, passed out or feeling faint, confusion, difficult to awaken, SOB, overdose of medications, rectal bleeding or black, tarry stools, diarrhea, heat exhaustion.  Per protocol recommend patient go to the ED now. Declines this nurse to call 911. Patient states she is at work and will have someone at work take her to the hospital and verbalized understanding.

## 2024-05-25 NOTE — ED Triage Notes (Signed)
 Pt reports chills, body aches ONLY after eating x 3 days.   C/o dizziness (feeling fatigued) and nausea, poor po intake x 3 days. Pt reports increased stress this week, difficulty sleeping x5 days.
